# Patient Record
Sex: Female | Born: 2000 | Race: White | Hispanic: No | Marital: Single | Smoking: Never smoker
Health system: Southern US, Community
[De-identification: ages and names within clinical notes are randomized; demographics above are authoritative.]

---
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
## 2023-04-27 ENCOUNTER — Emergency Department (HOSPITAL_BASED_OUTPATIENT_CLINIC_OR_DEPARTMENT_OTHER)

## 2023-04-27 ENCOUNTER — Encounter (HOSPITAL_BASED_OUTPATIENT_CLINIC_OR_DEPARTMENT_OTHER): Payer: Self-pay | Admitting: Emergency Medicine

## 2023-04-27 ENCOUNTER — Other Ambulatory Visit: Payer: Self-pay

## 2023-04-27 ENCOUNTER — Emergency Department (HOSPITAL_BASED_OUTPATIENT_CLINIC_OR_DEPARTMENT_OTHER)
Admission: EM | Admit: 2023-04-27 | Discharge: 2023-04-28 | Disposition: A | Discharge status: Home / Self Care | Attending: Emergency Medicine | Admitting: Emergency Medicine

## 2023-04-27 DIAGNOSIS — R10A0 Flank pain, unspecified side: Secondary | ICD-10-CM

## 2023-04-27 DIAGNOSIS — R109 Unspecified abdominal pain: Secondary | ICD-10-CM

## 2023-04-27 DIAGNOSIS — N200 Calculus of kidney: Secondary | ICD-10-CM

## 2023-04-27 DIAGNOSIS — N132 Hydronephrosis with renal and ureteral calculous obstruction: Secondary | ICD-10-CM | POA: Insufficient documentation

## 2023-04-27 LAB — URINALYSIS, ROUTINE W REFLEX MICROSCOPIC
Bilirubin Urine: NEGATIVE
Glucose, UA: NEGATIVE mg/dL
Ketones, ur: 15 mg/dL — AB
Leukocytes,Ua: NEGATIVE
Nitrite: NEGATIVE
RBC / HPF: >50 RBC/hpf (ref 0–5)
Specific Gravity, Urine: 1.025 (ref 1.005–1.030)
pH: 6 (ref 5.0–8.0)

## 2023-04-27 LAB — PREGNANCY, URINE: Preg Test, Ur: NEGATIVE

## 2023-04-27 NOTE — ED Triage Notes (Signed)
 Right flank pain into groin. Staying hydrated- notes dark urine-amber. Started this AM. HX renal stones.

## 2023-04-27 NOTE — ED Notes (Signed)
 Lt grn Lav sent to lab.

## 2023-04-28 LAB — COMPREHENSIVE METABOLIC PANEL
ALT: 18 U/L (ref 0–44)
AST: 22 U/L (ref 15–41)
Albumin: 4.7 g/dL (ref 3.5–5.0)
Alkaline Phosphatase: 69 U/L (ref 38–126)
Anion gap: 13 (ref 5–15)
BUN: 21 mg/dL — ABNORMAL HIGH (ref 6–20)
CO2: 22 mmol/L (ref 22–32)
Calcium: 9.2 mg/dL (ref 8.9–10.3)
Chloride: 100 mmol/L (ref 98–111)
Creatinine, Ser: 1.05 mg/dL — ABNORMAL HIGH (ref 0.44–1.00)
GFR, Estimated: >60 mL/min (ref >60)
Glucose, Bld: 105 mg/dL — ABNORMAL HIGH (ref 70–99)
Potassium: 3.7 mmol/L (ref 3.5–5.1)
Sodium: 135 mmol/L (ref 135–145)
Total Bilirubin: 0.5 mg/dL (ref 0.0–1.2)
Total Protein: 7.3 g/dL (ref 6.5–8.1)

## 2023-04-28 LAB — CBC WITH DIFFERENTIAL/PLATELET
Abs Immature Granulocytes: 0.02 10*3/uL (ref 0.00–0.07)
Basophils Absolute: 0.1 10*3/uL (ref 0.0–0.1)
Basophils Relative: 1 %
Eosinophils Absolute: 0.1 10*3/uL (ref 0.0–0.5)
Eosinophils Relative: 1 %
HCT: 39.2 % (ref 36.0–46.0)
Hemoglobin: 13.7 g/dL (ref 12.0–15.0)
Immature Granulocytes: 0 %
Lymphocytes Relative: 27 %
Lymphs Abs: 3 10*3/uL (ref 0.7–4.0)
MCH: 32.3 pg (ref 26.0–34.0)
MCHC: 34.9 g/dL (ref 30.0–36.0)
MCV: 92.5 fL (ref 80.0–100.0)
Monocytes Absolute: 0.9 10*3/uL (ref 0.1–1.0)
Monocytes Relative: 8 %
Neutro Abs: 7.1 10*3/uL (ref 1.7–7.7)
Neutrophils Relative %: 63 %
Platelets: 266 10*3/uL (ref 150–400)
RBC: 4.24 MIL/uL (ref 3.87–5.11)
RDW: 12.1 % (ref 11.5–15.5)
WBC: 11.1 10*3/uL — ABNORMAL HIGH (ref 4.0–10.5)
nRBC: 0 % (ref 0.0–0.2)

## 2023-04-28 LAB — LIPASE, BLOOD: Lipase: 27 U/L (ref 11–51)

## 2023-04-28 MED ORDER — HYDROCODONE-ACETAMINOPHEN 5-325 MG PO TABS: 1.0000 | ORAL_TABLET | ORAL | 0 refills | Status: AC | PRN

## 2023-04-28 MED ORDER — TAMSULOSIN HCL 0.4 MG PO CAPS: 0.4000 mg | ORAL_CAPSULE | Freq: Every day | ORAL | 0 refills | Status: AC

## 2023-04-28 MED ORDER — ONDANSETRON 4 MG PO TBDP
4.0000 mg | ORAL_TABLET | Freq: Once | ORAL | Status: AC
  Administered 2023-04-28: 4 mg via ORAL
  Filled 2023-04-28: qty 1

## 2023-04-28 MED ORDER — HYDROCODONE-ACETAMINOPHEN 5-325 MG PO TABS
1.0000 | ORAL_TABLET | Freq: Once | ORAL | Status: AC
  Administered 2023-04-28: 1 via ORAL
  Filled 2023-04-28: qty 1

## 2023-04-28 NOTE — Discharge Instructions (Addendum)
 It was a pleasure caring for you today in the emergency department.  Be sure to drink plenty of fluids over the next few days.  Get plenty of rest.  If you begin having worsening pain, fever, vomiting, pain with urination, Or any other worsening or worrisome symptoms- please return to the ER.    Please follow-up with your PCP.  Please follow-up with urology if symptoms persist beyond 7 days.

## 2023-04-28 NOTE — ED Provider Notes (Signed)
 [place] EMERGENCY DEPARTMENT AT [facility] Provider Note  CSN: [id number] Arrival date & time: 04/27/23 2209  Chief Complaint(s) Flank Pain  HPI Kristina Davenport is a 23 y.o. female with past medical history as below, significant for nephrolithiasis who presents to the ED with complaint of right flank pain  Onset of discomfort this morning, right flank pain with radiation to her right lower quadrant.  Feels similar to prior kidney stone.  No vomiting, no fevers, no change to urination, no dysuria urgency or hematuria.  No change to bowel function.  She is tolerant p.o. without difficulty.  Drinking extra water over the past 12 hours.  Denies prior abdominal surgery, denies prior urology evaluation, denies prior intervention to remove prior kidney stone  Past Medical History History reviewed. No pertinent past medical history. There are no active problems to display for this patient.  Home Medication(s) Prior to Admission medications   Medication Sig Start Date End Date Taking? Authorizing Provider  HYDROcodone-acetaminophen (NORCO/VICODIN) 5-325 MG tablet Take 1 tablet by mouth every 4 (four) hours as needed. 04/28/23  Yes Tanda Rockers A, DO  tamsulosin (FLOMAX) 0.4 MG CAPS capsule Take 1 capsule (0.4 mg total) by mouth daily for 14 days. 04/28/23 05/12/23 Yes Sloan Leiter, DO  ibuprofen (CHILDRENS MOTRIN) 100 MG/5ML suspension Take 26.1 mLs (522 mg total) by mouth every 6 (six) hours as needed for mild pain or moderate pain. 09/24/16   Sherrilee Gilles, NP  ondansetron (ZOFRAN ODT) 4 MG disintegrating tablet Take 1 tablet (4 mg total) by mouth every 8 (eight) hours as needed for nausea or vomiting. 09/24/16   Ihor Dow Nadara Mustard, NP                                                                                                                                    Past Surgical History History reviewed. No pertinent surgical history. Family History History reviewed. No  pertinent family history.  Social History Social History   Tobacco Use   Smoking status: Never   Smokeless tobacco: Never   Allergies Patient has no known allergies.  Review of Systems A thorough review of systems was obtained and all systems are negative except as noted in the HPI and PMH.   Physical Exam Vital Signs  I have reviewed the triage vital signs BP 114/76   Pulse 64   Temp (!) 97.5 F (36.4 C) (Oral)   Resp 16   Ht 5' (1.524 m)   Wt 55.8 kg   SpO2 100%   BMI 24.02 kg/m  Physical Exam Vitals and nursing note reviewed.  Constitutional:      General: She is not in acute distress.    Appearance: Normal appearance. She is well-developed. She is not ill-appearing.  HENT:     Head: Normocephalic and atraumatic.     Right Ear: External ear normal.     Left Ear: External ear normal.  Nose: Nose normal.     Mouth/Throat:     Mouth: Mucous membranes are moist.  Eyes:     General: No scleral icterus.       Right eye: No discharge.        Left eye: No discharge.  Cardiovascular:     Rate and Rhythm: Normal rate.  Pulmonary:     Effort: Pulmonary effort is normal. No respiratory distress.     Breath sounds: No stridor.  Abdominal:     General: Abdomen is flat. There is no distension.     Palpations: Abdomen is soft.     Tenderness: There is abdominal tenderness. There is no guarding or rebound.    Musculoskeletal:        General: No deformity.     Cervical back: No rigidity.  Skin:    General: Skin is warm and dry.     Coloration: Skin is not cyanotic, jaundiced or pale.  Neurological:     Mental Status: She is alert and oriented to person, place, and time.     GCS: GCS eye subscore is 4. GCS verbal subscore is 5. GCS motor subscore is 6.  Psychiatric:        Speech: Speech normal.        Behavior: Behavior normal. Behavior is cooperative.     ED Results and Treatments Labs (all labs ordered are listed, but only abnormal results are  displayed) Labs Reviewed  URINALYSIS, ROUTINE W REFLEX MICROSCOPIC - Abnormal; Notable for the following components:      Result Value   APPearance HAZY (*)    Hgb urine dipstick LARGE (*)    Ketones, ur 15 (*)    Protein, ur TRACE (*)    Bacteria, UA RARE (*)    All other components within normal limits  CBC WITH DIFFERENTIAL/PLATELET - Abnormal; Notable for the following components:   WBC 11.1 (*)    All other components within normal limits  COMPREHENSIVE METABOLIC PANEL - Abnormal; Notable for the following components:   Glucose, Bld 105 (*)    BUN 21 (*)    Creatinine, Ser 1.05 (*)    All other components within normal limits  URINE CULTURE  PREGNANCY, URINE  LIPASE, BLOOD                                                                                                                          Radiology CT Renal Stone Study Result Date: 04/27/2023 CLINICAL DATA:  Abdominal and flank pain with stone suspected. EXAM: CT ABDOMEN AND PELVIS WITHOUT CONTRAST TECHNIQUE: Multidetector CT imaging of the abdomen and pelvis was performed following the standard protocol without IV contrast. RADIATION DOSE REDUCTION: This exam was performed according to the departmental dose-optimization program which includes automated exposure control, adjustment of the mA and/or kV according to patient size and/or use of iterative reconstruction technique. COMPARISON:  09/24/2016 FINDINGS: Lower chest: Lung bases are clear. Hepatobiliary: No focal liver abnormality is seen.  No gallstones, gallbladder wall thickening, or biliary dilatation. Pancreas: Unremarkable. No pancreatic ductal dilatation or surrounding inflammatory changes. Spleen: Normal in size without focal abnormality. Adrenals/Urinary Tract: No adrenal gland nodules. 3 mm stone in the proximal right ureter at the level of L3-4. Moderate proximal hydronephrosis. Distal ureter is decompressed. Left kidney, left ureter, and the bladder are unremarkable.  Stomach/Bowel: Stomach, small bowel, and colon are not abnormally distended. Stool throughout the colon. No wall thickening or inflammatory changes. Appendix is not identified. Vascular/Lymphatic: No significant vascular findings are present. No enlarged abdominal or pelvic lymph nodes. Reproductive: Uterus and bilateral adnexa are unremarkable. Other: No abdominal wall hernia or abnormality. No abdominopelvic ascites. Musculoskeletal: No acute or significant osseous findings. IMPRESSION: 1. 3 mm stone in the proximal right ureter with moderate proximal obstruction. 2. Otherwise, no acute changes. Electronically Signed   By: Burman Nieves M.D.   On: 04/27/2023 23:33    Pertinent labs & imaging results that were available during my care of the patient were reviewed by me and considered in my medical decision making (see MDM for details).  Medications Ordered in ED Medications  HYDROcodone-acetaminophen (NORCO/VICODIN) 5-325 MG per tablet 1 tablet (has no administration in time range)  ondansetron (ZOFRAN-ODT) disintegrating tablet 4 mg (has no administration in time range)                                                                                                                                     Procedures Procedures  (including critical care time)  Medical Decision Making / ED Course    Medical Decision Making:    Masiyah Jorstad is a 23 y.o. female with past medical history as below, significant for nephrolithiasis who presents to the ED with complaint of right flank pain. The complaint involves an extensive differential diagnosis and also carries with it a high risk of complications and morbidity.  Serious etiology was considered. Ddx includes but is not limited to: Differential diagnosis includes but is not exclusive to ectopic pregnancy, ovarian cyst, ovarian torsion, acute appendicitis, urinary tract infection, endometriosis, bowel obstruction, hernia, colitis, renal colic,  gastroenteritis, volvulus etc.   Complete initial physical exam performed, notably the patient was in distress, sitting comfortably, abdomen.    Reviewed and confirmed nursing documentation for past medical history, family history, social history.  Vital signs reviewed.    Clinical Course as of 04/28/23 0121  Mon Apr 28, 2023  0008 Ct renal with 3mm proximal obstructing nephrolithiasis [SG]  0008 Bacteria, UA(!): RARE Bacteria noted but negative nitrite leukocytes, 0-5 WBCs.  She is not septic.  Infection less likely [SG]  0119 Creatinine(!): 1.05 Similar to prior  [SG]    Clinical Course User Index [SG] Sloan Leiter, DO    Brief summary: Well-appearing 23 year old female here with right-sided flank pain feels similar prior nephrolithiasis.  Abdomen is nonperitoneal, she is not febrile, she is well-appearing.  Urinalysis without  overt infection, CT renal with 3 mm obstructing nephrolithiasis  Labs are stable, imaging does show 3 mm nephrolithiasis.  She is feeling much better, tolerant p.o. difficulty.  HDS.  Pain well-controlled. Not septic. Plan for outpatient management, follow-up with urologist if symptoms persist  The patient improved significantly and was discharged in stable condition. Detailed discussions were had with the patient/guardian regarding current findings, and need for close f/u with PCP or on call doctor. The patient/guardian has been instructed to return immediately if the symptoms worsen in any way for re-evaluation. Patient/guardian verbalized understanding and is in agreement with current care plan. All questions answered prior to discharge.                Additional history obtained: -Additional history obtained from family -External records from outside source obtained and reviewed including: Chart review including previous notes, labs, imaging, consultation notes including  Prior ED visit, PDMP, home medications   Lab Tests: -I ordered,  reviewed, and interpreted labs.   The pertinent results include:   Labs Reviewed  URINALYSIS, ROUTINE W REFLEX MICROSCOPIC - Abnormal; Notable for the following components:      Result Value   APPearance HAZY (*)    Hgb urine dipstick LARGE (*)    Ketones, ur 15 (*)    Protein, ur TRACE (*)    Bacteria, UA RARE (*)    All other components within normal limits  CBC WITH DIFFERENTIAL/PLATELET - Abnormal; Notable for the following components:   WBC 11.1 (*)    All other components within normal limits  COMPREHENSIVE METABOLIC PANEL - Abnormal; Notable for the following components:   Glucose, Bld 105 (*)    BUN 21 (*)    Creatinine, Ser 1.05 (*)    All other components within normal limits  URINE CULTURE  PREGNANCY, URINE  LIPASE, BLOOD    Notable for labs stable  EKG   EKG Interpretation Date/Time:    Ventricular Rate:    PR Interval:    QRS Duration:    QT Interval:    QTC Calculation:   R Axis:      Text Interpretation:           Imaging Studies ordered: I ordered imaging studies including CT renal I independently visualized the following imaging with scope of interpretation limited to determining acute life threatening conditions related to emergency care; findings noted above I independently visualized and interpreted imaging. I agree with the radiologist interpretation   Medicines ordered and prescription drug management: Meds ordered this encounter  Medications   HYDROcodone-acetaminophen (NORCO/VICODIN) 5-325 MG tablet    Sig: Take 1 tablet by mouth every 4 (four) hours as needed.    Dispense:  15 tablet    Refill:  0   tamsulosin (FLOMAX) 0.4 MG CAPS capsule    Sig: Take 1 capsule (0.4 mg total) by mouth daily for 14 days.    Dispense:  14 capsule    Refill:  0   HYDROcodone-acetaminophen (NORCO/VICODIN) 5-325 MG per tablet 1 tablet    Refill:  0   ondansetron (ZOFRAN-ODT) disintegrating tablet 4 mg    -I have reviewed the patients home medicines  and have made adjustments as needed   Consultations Obtained:   Cardiac Monitoring: Continuous pulse oximetry interpreted by myself, 100% on RA.    Social Determinants of Health:  Diagnosis or treatment significantly limited by social determinants of health: na   Reevaluation: After the interventions noted above, I reevaluated the patient and found  that they have improved  Co morbidities that complicate the patient evaluation History reviewed. No pertinent past medical history.    Dispostion: Disposition decision including need for hospitalization was considered, and patient discharged from emergency department.    Final Clinical Impression(s) / ED Diagnoses Final diagnoses:  Nephrolithiasis  Flank pain        Sloan Leiter, DO 04/28/23 0121

## 2023-04-29 LAB — URINE CULTURE: Culture: NO GROWTH
